# Patient Record
Sex: Female | Born: 2005 | Race: White | Hispanic: Yes | Marital: Single | Smoking: Never smoker
Health system: Southern US, Community
[De-identification: ages and names within clinical notes are randomized; demographics above are authoritative.]

## PROBLEM LIST (undated history)

## (undated) DIAGNOSIS — J189 Pneumonia, unspecified organism: Secondary | ICD-10-CM

---
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
## 2023-03-26 IMAGING — DX DG ELBOW COMPLETE 3+V*R*
4 series · 4 of 4 positions shown · non-contrast
Comparison: None.

CLINICAL DATA: Reported wrist sprain/wrist injury which occurred
[REDACTED] (04/13/2021) worsening pain

EXAM:
RIGHT ELBOW - COMPLETE 3+ VIEW

[elbow ap]
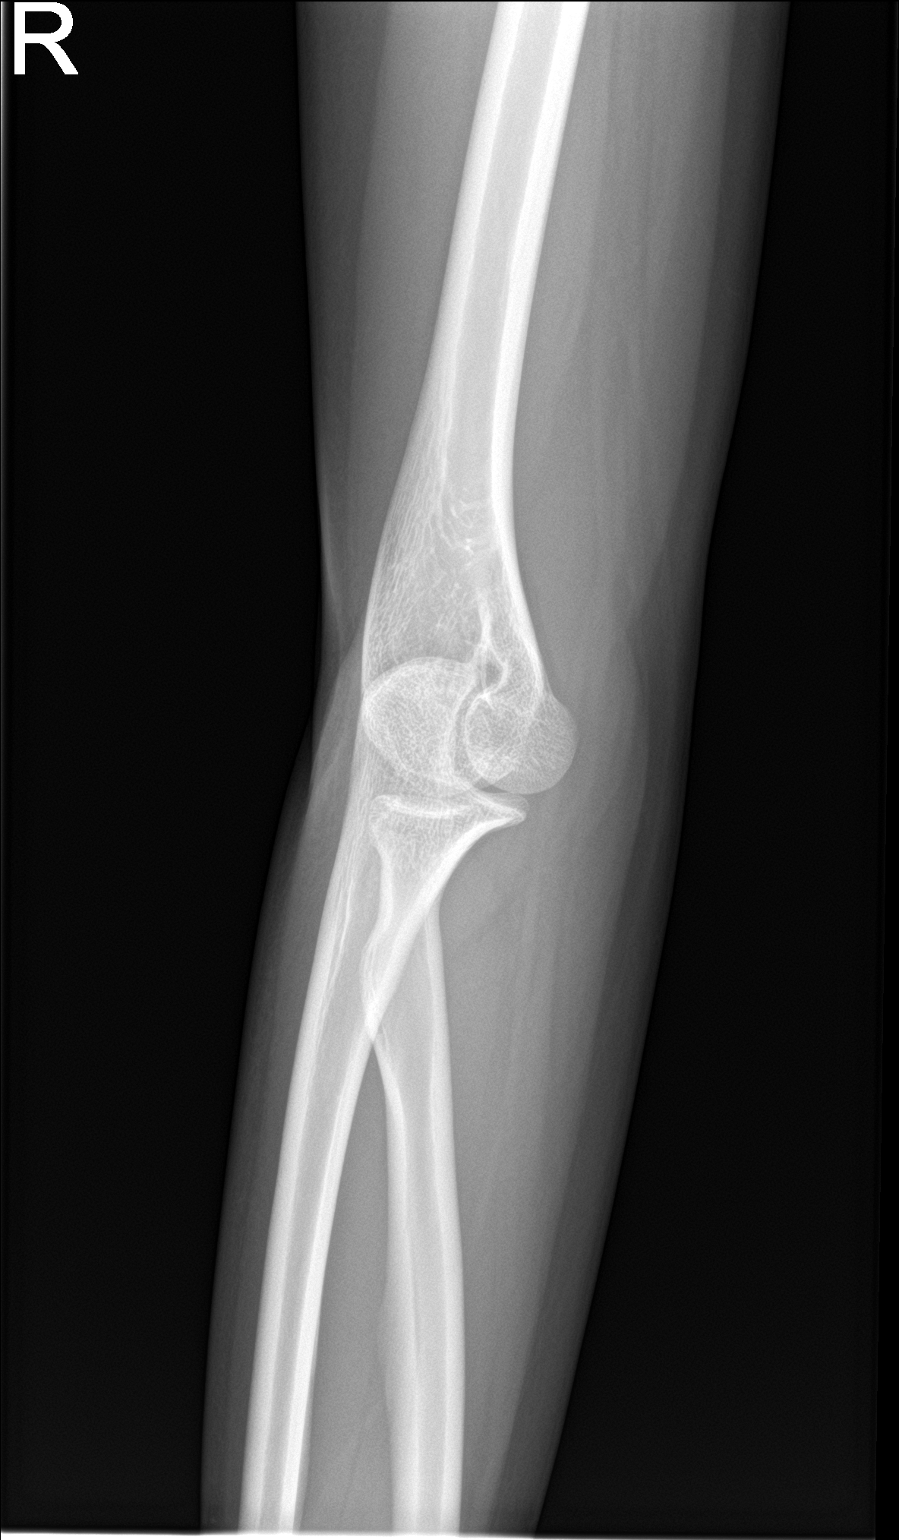

[elbow obl (1 of 2)]
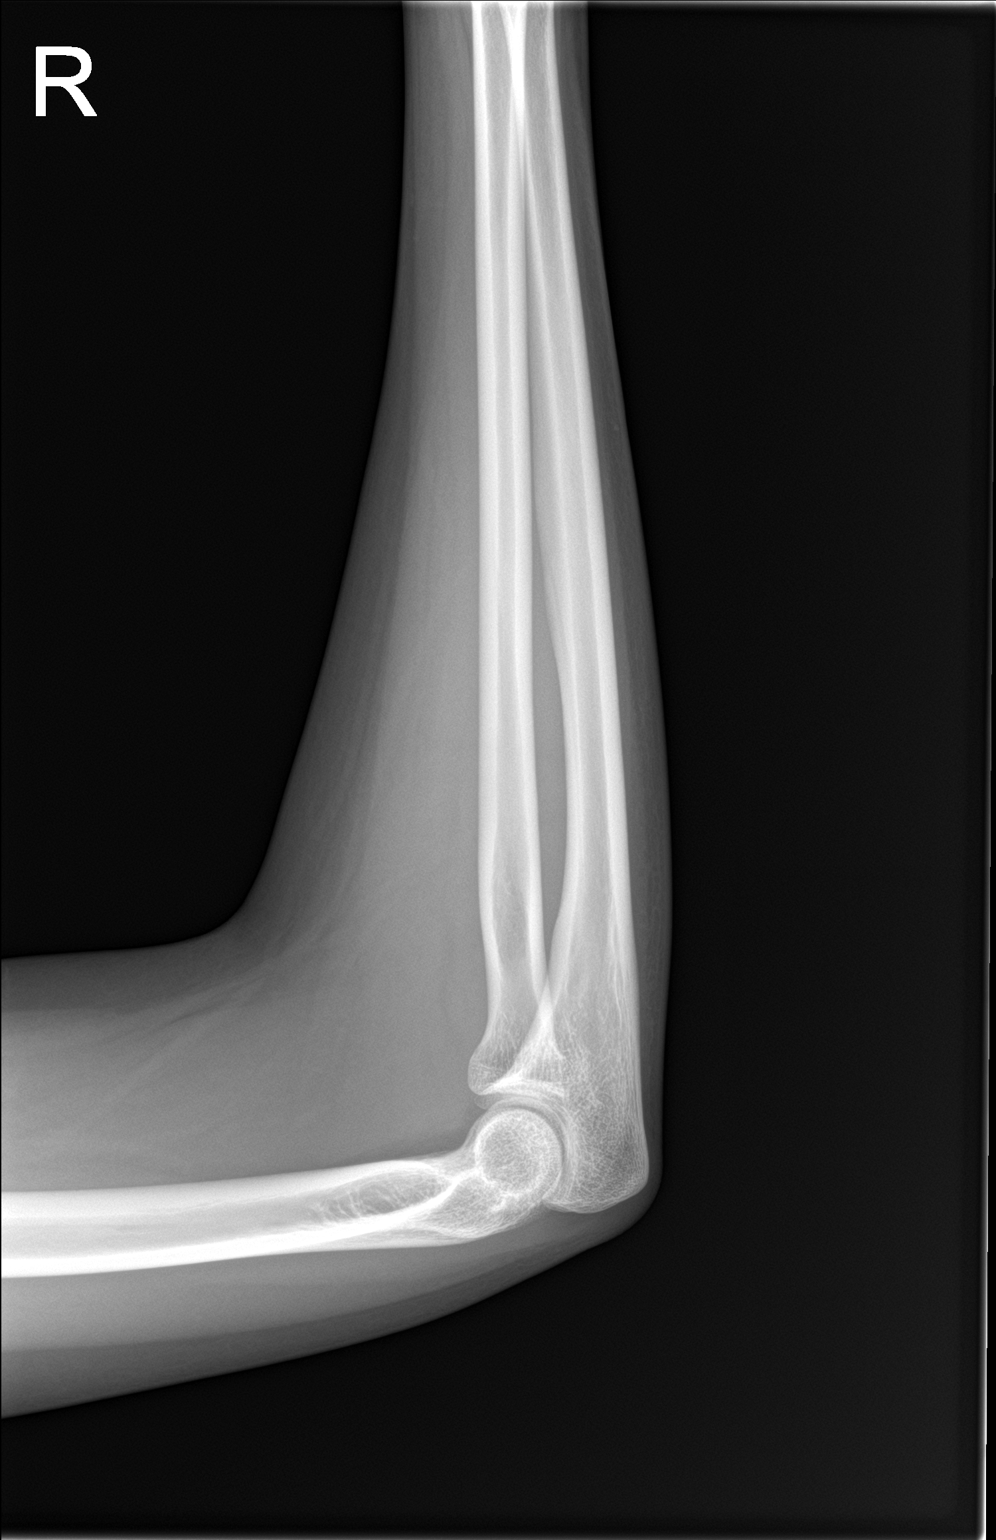

[elbow obl (2 of 2)]
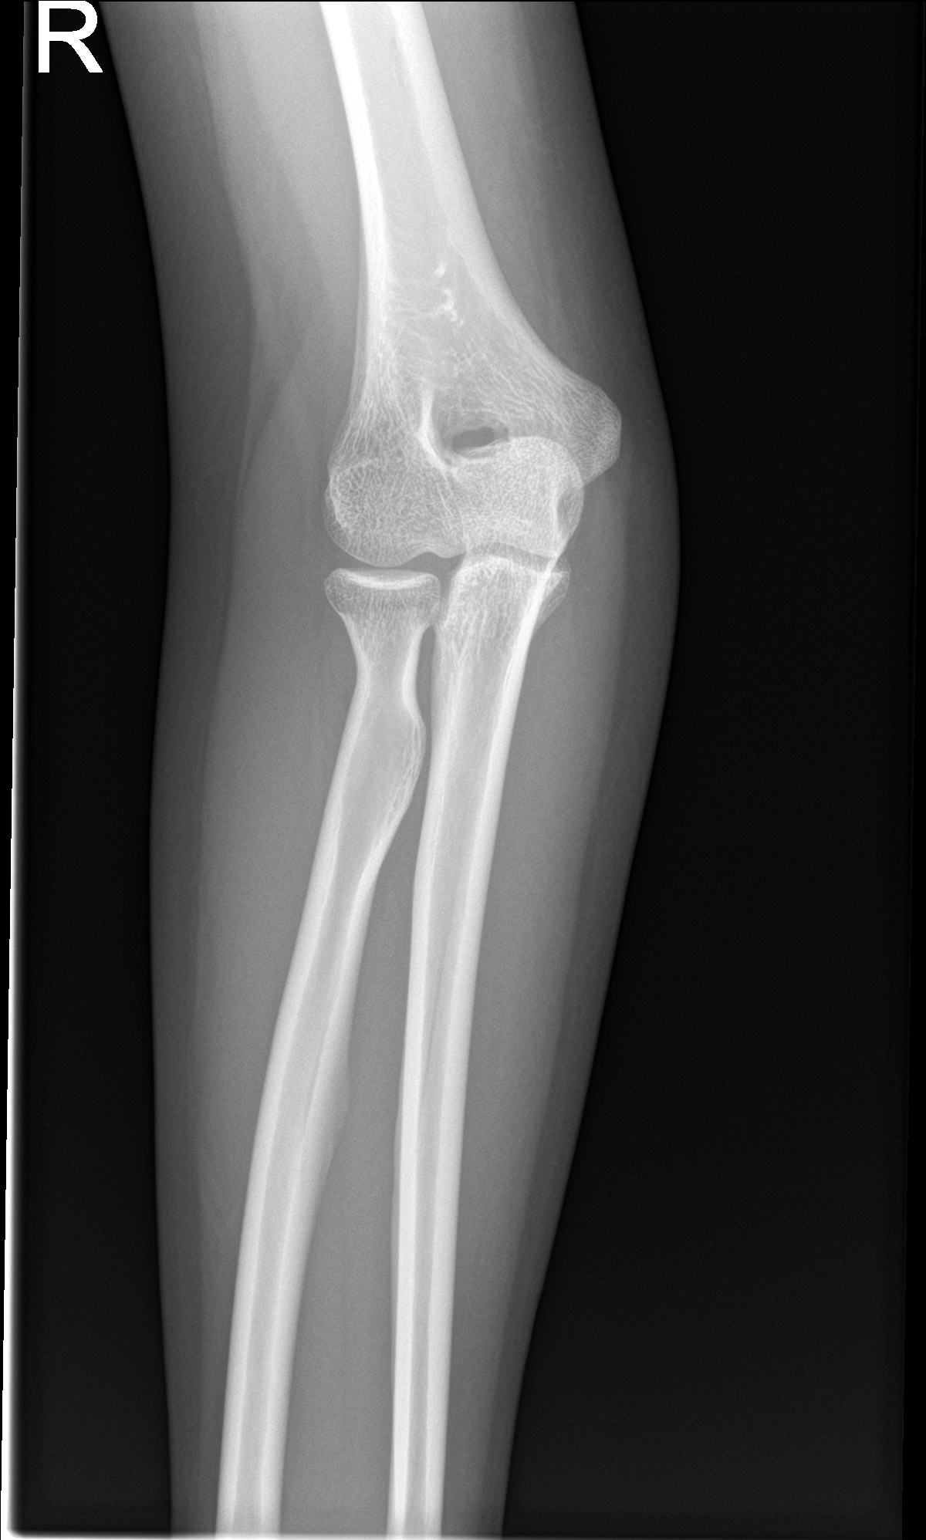

[elbow lat]
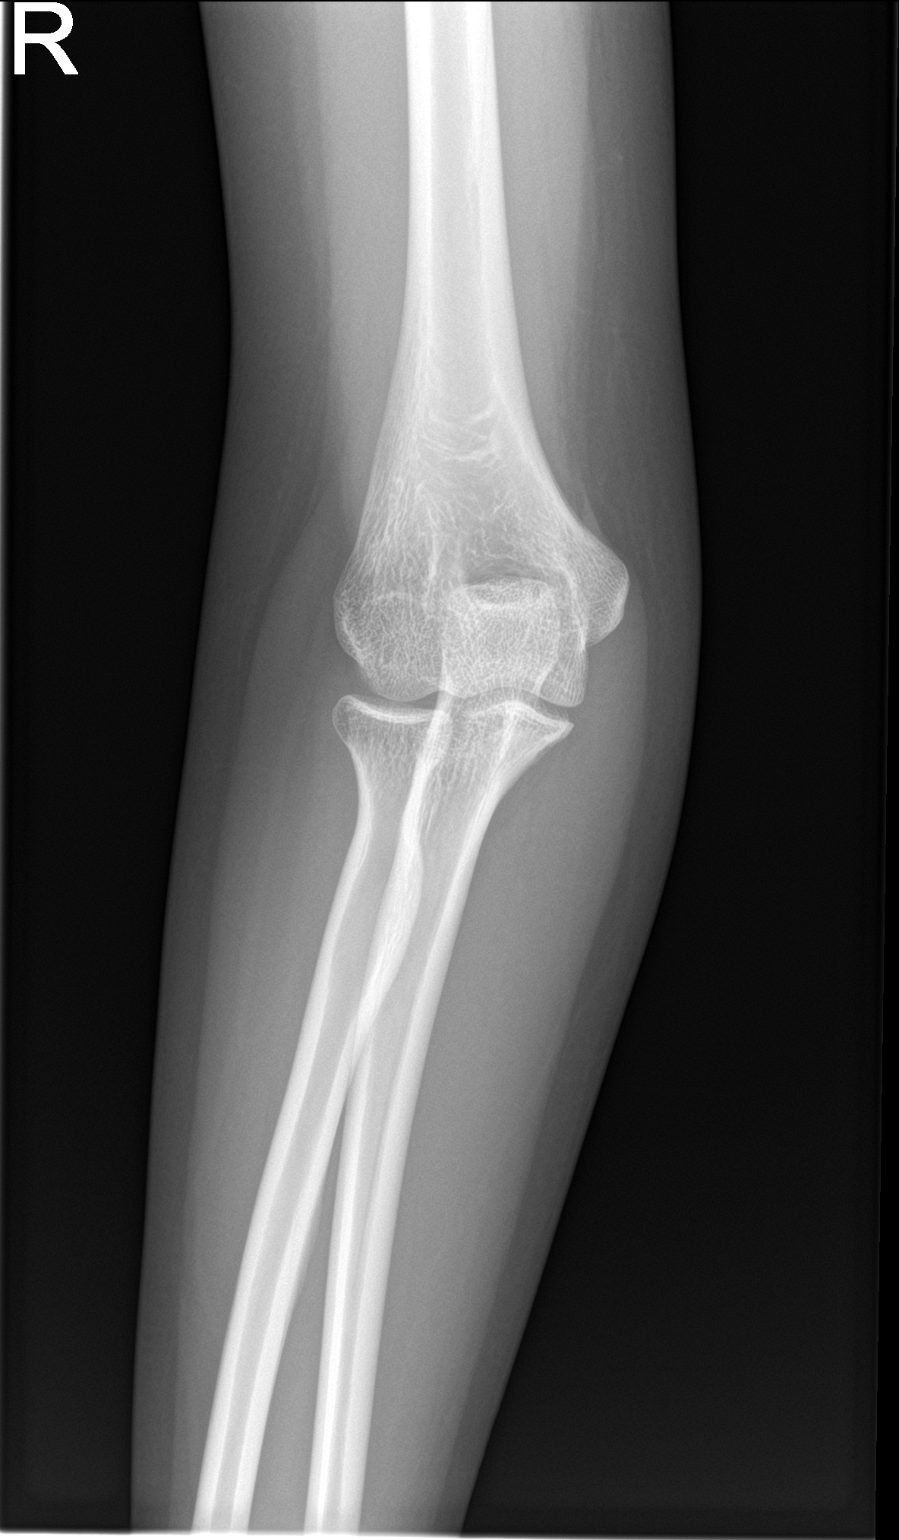

[4 of 4 positions shown; findings below may reference images not displayed]

FINDINGS: There is no evidence of fracture, dislocation, or joint effusion.
There is no evidence of arthropathy or other focal bone abnormality.
Soft tissues are unremarkable.
IMPRESSION: Negative.

## 2025-01-16 ENCOUNTER — Ambulatory Visit: Payer: Self-pay | Admitting: Certified Nurse Midwife

## 2025-01-16 ENCOUNTER — Other Ambulatory Visit: Payer: Self-pay

## 2025-01-16 VITALS — BP 105/57 | HR 76 | Ht 65.0 in | Wt 143.0 lb

## 2025-01-16 DIAGNOSIS — Z3200 Encounter for pregnancy test, result unknown: Secondary | ICD-10-CM

## 2025-01-16 LAB — POCT PREGNANCY, URINE: Preg Test, Ur: POSITIVE — AB

## 2025-01-16 NOTE — Progress Notes (Signed)
 Possible Pregnancy  Here today for pregnancy confirmation. UPT in office today is positive. Pt reports first positive home UPT approximately end of January. Reviewed dating with patient:   LMP: 12/01/24 EDD: 09/07/25 6w 4d today  OB history reviewed. Reviewed medications and allergies with patient; list of medications safe to take during pregnancy given.  Recommended pt begin prenatal vitamin and schedule prenatal care. Patient reports regular 31 day cycles. Would like to begin prenatal care in office. Denies any vaginal bleeding and/or pain. Reviewed MAU precautions with patient. Informed patient prenatal care begins around 10 weeks.  Patricia Serrano, CNM welcomed patient to the clinic and all questions addressed. Patient to schedule virtual new OB intake and initial prenatal care at checkout.     Patricia Pendleton, RN 01/16/2025  1:32 PM

## 2025-01-16 NOTE — Patient Instructions (Signed)

## 2025-02-18 ENCOUNTER — Encounter: Payer: Self-pay | Admitting: Student
